# Patient Record
Sex: Male | Born: 2000
Health system: Southern US, Community
[De-identification: ages and names within clinical notes are randomized; demographics above are authoritative.]

## PROBLEM LIST (undated history)

## (undated) DIAGNOSIS — S21339A Puncture wound without foreign body of unspecified front wall of thorax with penetration into thoracic cavity, initial encounter: Secondary | ICD-10-CM

## (undated) DIAGNOSIS — T7840XA Allergy, unspecified, initial encounter: Secondary | ICD-10-CM

## (undated) DIAGNOSIS — J45909 Unspecified asthma, uncomplicated: Secondary | ICD-10-CM

## (undated) DIAGNOSIS — W3400XA Accidental discharge from unspecified firearms or gun, initial encounter: Secondary | ICD-10-CM

## (undated) HISTORY — DX: Allergy, unspecified, initial encounter: T78.40XA

## (undated) HISTORY — DX: Accidental discharge from unspecified firearms or gun, initial encounter: W34.00XA

## (undated) HISTORY — PX: KNEE ARTHROSCOPY W/ MENISCAL REPAIR: SHX1877

## (undated) HISTORY — DX: Puncture wound without foreign body of unspecified front wall of thorax with penetration into thoracic cavity, initial encounter: S21.339A

## (undated) HISTORY — PX: WISDOM TOOTH EXTRACTION: SHX21

---
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
## 2018-09-09 ENCOUNTER — Ambulatory Visit: Payer: 59 | Admitting: Family Medicine
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
## 2019-04-12 ENCOUNTER — Ambulatory Visit (INDEPENDENT_AMBULATORY_CARE_PROVIDER_SITE_OTHER): Payer: Managed Care, Other (non HMO) | Admitting: Medical

## 2019-04-12 ENCOUNTER — Other Ambulatory Visit: Payer: Self-pay

## 2019-04-12 ENCOUNTER — Encounter: Payer: Self-pay | Admitting: Medical

## 2019-04-12 VITALS — BP 126/60 | HR 85 | Temp 98.4°F | Ht 70.0 in | Wt 267.8 lb

## 2019-04-12 DIAGNOSIS — Z833 Family history of diabetes mellitus: Secondary | ICD-10-CM | POA: Diagnosis not present

## 2019-04-12 DIAGNOSIS — Z0000 Encounter for general adult medical examination without abnormal findings: Secondary | ICD-10-CM | POA: Diagnosis not present

## 2019-04-12 DIAGNOSIS — R61 Generalized hyperhidrosis: Secondary | ICD-10-CM | POA: Insufficient documentation

## 2019-04-12 DIAGNOSIS — Z111 Encounter for screening for respiratory tuberculosis: Secondary | ICD-10-CM

## 2019-04-12 DIAGNOSIS — Z6838 Body mass index (BMI) 38.0-38.9, adult: Secondary | ICD-10-CM | POA: Diagnosis not present

## 2019-04-12 DIAGNOSIS — Z131 Encounter for screening for diabetes mellitus: Secondary | ICD-10-CM

## 2019-04-12 DIAGNOSIS — M795 Residual foreign body in soft tissue: Secondary | ICD-10-CM

## 2019-04-12 DIAGNOSIS — G479 Sleep disorder, unspecified: Secondary | ICD-10-CM

## 2019-04-12 DIAGNOSIS — J4520 Mild intermittent asthma, uncomplicated: Secondary | ICD-10-CM | POA: Insufficient documentation

## 2019-04-12 MED ORDER — ALBUTEROL SULFATE HFA 108 (90 BASE) MCG/ACT IN AERS: 2.0000 | INHALATION_SPRAY | Freq: Four times a day (QID) | RESPIRATORY_TRACT | 1 refills | Status: DC | PRN
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
## 2019-04-13 LAB — COMPREHENSIVE METABOLIC PANEL
ALT: 32 IU/L (ref 0–44)
AST: 23 IU/L (ref 0–40)
Albumin/Globulin Ratio: 1.8 (ref 1.2–2.2)
Albumin: 4.8 g/dL (ref 4.1–5.2)
Alkaline Phosphatase: 98 IU/L (ref 39–117)
BUN/Creatinine Ratio: 9 (ref 9–20)
BUN: 9 mg/dL (ref 6–20)
Bilirubin Total: 0.2 mg/dL (ref 0.0–1.2)
CO2: 24 mmol/L (ref 20–29)
Calcium: 9.8 mg/dL (ref 8.7–10.2)
Chloride: 101 mmol/L (ref 96–106)
Creatinine, Ser: 0.98 mg/dL (ref 0.76–1.27)
GFR calc Af Amer: 128 mL/min/{1.73_m2} (ref >59)
GFR calc non Af Amer: 111 mL/min/{1.73_m2} (ref >59)
Globulin, Total: 2.7 g/dL (ref 1.5–4.5)
Glucose: 80 mg/dL (ref 65–99)
Potassium: 4.5 mmol/L (ref 3.5–5.2)
Sodium: 141 mmol/L (ref 134–144)
Total Protein: 7.5 g/dL (ref 6.0–8.5)

## 2019-04-13 LAB — CBC WITH DIFFERENTIAL/PLATELET
Basophils Absolute: 0 10*3/uL (ref 0.0–0.2)
Basos: 0 %
EOS (ABSOLUTE): 0.1 10*3/uL (ref 0.0–0.4)
Eos: 2 %
Hematocrit: 39.4 % (ref 37.5–51.0)
Hemoglobin: 13.2 g/dL (ref 13.0–17.7)
Immature Grans (Abs): 0 10*3/uL (ref 0.0–0.1)
Immature Granulocytes: 0 %
Lymphocytes Absolute: 2.6 10*3/uL (ref 0.7–3.1)
Lymphs: 34 %
MCH: 31.9 pg (ref 26.6–33.0)
MCHC: 33.5 g/dL (ref 31.5–35.7)
MCV: 95 fL (ref 79–97)
Monocytes Absolute: 0.6 10*3/uL (ref 0.1–0.9)
Monocytes: 8 %
Neutrophils Absolute: 4.1 10*3/uL (ref 1.4–7.0)
Neutrophils: 56 %
Platelets: 300 10*3/uL (ref 150–450)
RBC: 4.14 x10E6/uL (ref 4.14–5.80)
RDW: 10.8 % — ABNORMAL LOW (ref 11.6–15.4)
WBC: 7.5 10*3/uL (ref 3.4–10.8)

## 2019-04-13 LAB — HEMOGLOBIN A1C
Est. average glucose Bld gHb Est-mCnc: 82 mg/dL
Hgb A1c MFr Bld: 4.5 % — ABNORMAL LOW (ref 4.8–5.6)

## 2019-04-13 LAB — TSH: TSH: 1.18 u[IU]/mL (ref 0.450–4.500)

## 2019-05-03 ENCOUNTER — Other Ambulatory Visit: Payer: Self-pay

## 2019-05-03 ENCOUNTER — Encounter (HOSPITAL_COMMUNITY): Payer: Self-pay | Admitting: Emergency Medicine

## 2019-05-03 ENCOUNTER — Emergency Department (HOSPITAL_COMMUNITY)
Admission: EM | Admit: 2019-05-03 | Discharge: 2019-05-03 | Disposition: A | Discharge status: Home / Self Care | Payer: Managed Care, Other (non HMO) | Attending: Emergency Medicine | Admitting: Emergency Medicine

## 2019-05-03 DIAGNOSIS — Y92019 Unspecified place in single-family (private) house as the place of occurrence of the external cause: Secondary | ICD-10-CM | POA: Insufficient documentation

## 2019-05-03 DIAGNOSIS — Y9301 Activity, walking, marching and hiking: Secondary | ICD-10-CM | POA: Insufficient documentation

## 2019-05-03 DIAGNOSIS — Y29XXXA Contact with blunt object, undetermined intent, initial encounter: Secondary | ICD-10-CM | POA: Diagnosis not present

## 2019-05-03 DIAGNOSIS — S91311A Laceration without foreign body, right foot, initial encounter: Secondary | ICD-10-CM | POA: Diagnosis not present

## 2019-05-03 DIAGNOSIS — Y999 Unspecified external cause status: Secondary | ICD-10-CM | POA: Diagnosis not present

## 2019-05-03 DIAGNOSIS — J4520 Mild intermittent asthma, uncomplicated: Secondary | ICD-10-CM | POA: Insufficient documentation

## 2019-05-03 NOTE — ED Triage Notes (Signed)
Pt reports cutting bottom of right foot on a baby gate around 10 last night. No bleeding at this time.

## 2019-05-03 NOTE — Discharge Instructions (Addendum)
Get help right away if:  You develop severe swelling around the wound.  Your pain suddenly increases and is severe.  You develop painful lumps near the wound or on skin anywhere else on your body.  You have a red streak going away from your wound.  The wound is on your hand or foot and you cannot properly move a finger or toe.  The wound is on your hand or foot, and you notice that your fingers or toes look pale or bluish.
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
## 2019-06-23 ENCOUNTER — Other Ambulatory Visit: Payer: Self-pay

## 2019-06-23 ENCOUNTER — Emergency Department (HOSPITAL_COMMUNITY)
Admission: EM | Admit: 2019-06-23 | Discharge: 2019-06-23 | Disposition: A | Discharge status: Home / Self Care | Payer: 59 | Attending: Emergency Medicine | Admitting: Emergency Medicine

## 2019-06-23 ENCOUNTER — Emergency Department (HOSPITAL_COMMUNITY): Payer: 59

## 2019-06-23 DIAGNOSIS — W3400XA Accidental discharge from unspecified firearms or gun, initial encounter: Secondary | ICD-10-CM | POA: Diagnosis not present

## 2019-06-23 DIAGNOSIS — Z87828 Personal history of other (healed) physical injury and trauma: Secondary | ICD-10-CM

## 2019-06-23 DIAGNOSIS — Y999 Unspecified external cause status: Secondary | ICD-10-CM | POA: Insufficient documentation

## 2019-06-23 DIAGNOSIS — Y939 Activity, unspecified: Secondary | ICD-10-CM | POA: Diagnosis not present

## 2019-06-23 DIAGNOSIS — Y929 Unspecified place or not applicable: Secondary | ICD-10-CM | POA: Insufficient documentation

## 2019-06-23 DIAGNOSIS — S70351D Superficial foreign body, right thigh, subsequent encounter: Secondary | ICD-10-CM

## 2019-06-23 DIAGNOSIS — S79921A Unspecified injury of right thigh, initial encounter: Secondary | ICD-10-CM | POA: Diagnosis present

## 2019-06-23 DIAGNOSIS — J45909 Unspecified asthma, uncomplicated: Secondary | ICD-10-CM | POA: Insufficient documentation

## 2019-06-23 DIAGNOSIS — S71141A Puncture wound with foreign body, right thigh, initial encounter: Secondary | ICD-10-CM | POA: Insufficient documentation

## 2019-06-23 MED ORDER — LIDOCAINE-EPINEPHRINE (PF) 2 %-1:200000 IJ SOLN
10.0000 mL | Freq: Once | INTRAMUSCULAR | Status: DC
  Filled 2019-06-23: qty 20

## 2019-06-23 MED ORDER — DOXYCYCLINE HYCLATE 100 MG PO CAPS: 100.0000 mg | ORAL_CAPSULE | Freq: Two times a day (BID) | ORAL | 0 refills | Status: DC
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
## 2019-10-18 ENCOUNTER — Ambulatory Visit
Admission: EM | Admit: 2019-10-18 | Discharge: 2019-10-18 | Disposition: A | Discharge status: Home / Self Care | Payer: 59 | Attending: Physician Assistant | Admitting: Physician Assistant

## 2019-10-18 ENCOUNTER — Other Ambulatory Visit: Payer: Self-pay

## 2019-10-18 DIAGNOSIS — Z1152 Encounter for screening for COVID-19: Secondary | ICD-10-CM

## 2019-10-18 NOTE — Discharge Instructions (Signed)

## 2019-10-18 NOTE — ED Triage Notes (Signed)
Pt requesting covid testing., no sx's. °

## 2019-10-20 LAB — NOVEL CORONAVIRUS, NAA: SARS-CoV-2, NAA: NOT DETECTED

## 2019-10-20 LAB — SARS-COV-2, NAA 2 DAY TAT

## 2019-12-06 ENCOUNTER — Telehealth: Payer: Self-pay

## 2019-12-06 ENCOUNTER — Other Ambulatory Visit: Payer: Self-pay

## 2019-12-06 ENCOUNTER — Ambulatory Visit: Payer: 59 | Admitting: Medical

## 2019-12-06 ENCOUNTER — Encounter: Payer: Self-pay | Admitting: Medical

## 2019-12-06 VITALS — BP 130/66 | HR 85 | Ht 70.0 in | Wt 250.0 lb

## 2019-12-06 DIAGNOSIS — M549 Dorsalgia, unspecified: Secondary | ICD-10-CM | POA: Insufficient documentation

## 2019-12-06 DIAGNOSIS — Z113 Encounter for screening for infections with a predominantly sexual mode of transmission: Secondary | ICD-10-CM | POA: Diagnosis not present

## 2019-12-06 DIAGNOSIS — M25512 Pain in left shoulder: Secondary | ICD-10-CM | POA: Diagnosis not present

## 2019-12-06 MED ORDER — NAPROXEN 500 MG PO TABS: 500.0000 mg | ORAL_TABLET | Freq: Two times a day (BID) | ORAL | 0 refills | Status: DC

## 2019-12-06 MED ORDER — METHOCARBAMOL 500 MG PO TABS: 500.0000 mg | ORAL_TABLET | Freq: Every evening | ORAL | 0 refills | Status: DC | PRN

## 2019-12-06 NOTE — Telephone Encounter (Signed)
Per your note at check you have agreed to see the pts. Girlfriend and child who both have mediciad. Her name is Daniel Ibarra 04/29/01 and his son name is Daniel Ibarra 05/27/18.

## 2019-12-06 NOTE — Progress Notes (Signed)
Done

## 2019-12-06 NOTE — Patient Instructions (Signed)
Your pain suggest muscle inflammation, muscle spasm, possibly even some arthritis from prior scapular fracture.  Recommendations:  Begin Naprosyn 500 mg twice daily with food for the next 7 to 10 days for pain and inflammation  Begin Robaxin muscle relaxer at bedtime the next few nights, then as needed for muscle spasms and soreness  You can use heat to the shoulder and upper back such as hot towel occasionally for heat therapy.  He can also do cold therapy with a cold wet towel or ice pack 20 minutes at a time.  You can use an arm sling as needed when it seems flared up  We will make a referral to physical therapy for additional recommendations  If not much improved within a month then we will get an updated x-ray   We will check STD screen today

## 2019-12-06 NOTE — Telephone Encounter (Signed)
That is correct. I did inform him that his child would be seen for non well visits as we don't participate in vaccine program with medicaid, so he would need child to see health dept or other pediatrician office for well visits and vaccines.
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
## 2019-12-07 LAB — HIV ANTIBODY (ROUTINE TESTING W REFLEX): HIV Screen 4th Generation wRfx: NONREACTIVE

## 2019-12-07 LAB — HEPATITIS B SURFACE ANTIGEN: Hepatitis B Surface Ag: NEGATIVE

## 2019-12-07 LAB — HEPATITIS C ANTIBODY: Hep C Virus Ab: 0.1 s/co ratio (ref 0.0–0.9)

## 2019-12-07 LAB — RPR: RPR Ser Ql: NONREACTIVE

## 2019-12-08 LAB — GC/CHLAMYDIA PROBE AMP
Chlamydia trachomatis, NAA: NEGATIVE
Neisseria Gonorrhoeae by PCR: NEGATIVE

## 2019-12-22 ENCOUNTER — Other Ambulatory Visit: Payer: Self-pay

## 2019-12-22 ENCOUNTER — Ambulatory Visit: Payer: 59 | Attending: Medical

## 2019-12-22 DIAGNOSIS — M6281 Muscle weakness (generalized): Secondary | ICD-10-CM | POA: Diagnosis present

## 2019-12-22 DIAGNOSIS — R0781 Pleurodynia: Secondary | ICD-10-CM

## 2019-12-22 DIAGNOSIS — M25512 Pain in left shoulder: Secondary | ICD-10-CM

## 2019-12-22 DIAGNOSIS — R0789 Other chest pain: Secondary | ICD-10-CM

## 2019-12-23 NOTE — Patient Instructions (Signed)
° °  ITB stretch for lateral L rib stretch
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
## 2020-01-05 ENCOUNTER — Telehealth: Payer: Self-pay

## 2020-01-05 ENCOUNTER — Ambulatory Visit: Payer: 59 | Admitting: Medical

## 2020-01-05 ENCOUNTER — Ambulatory Visit: Payer: 59 | Attending: Medical

## 2020-01-05 NOTE — Telephone Encounter (Signed)
Called pt regarding missed visit at 8am. Pt did not answer, so LM VM and reminded pt of next appt on 12/9 at 3pm. Requested pt call at least 24 hours in advance, if he cannot make the appt.   Bettey Mare. Corliss Marcus, PT, DPT

## 2020-01-06 ENCOUNTER — Encounter: Payer: Self-pay | Admitting: Medical

## 2020-01-12 ENCOUNTER — Ambulatory Visit: Payer: 59

## 2020-01-12 ENCOUNTER — Telehealth: Payer: Self-pay

## 2020-01-12 NOTE — Telephone Encounter (Signed)
Spoke to pt who requests to cancel all visits and discharge. Advised pt he may go back to physician for new prescription, should he want to come back.  Bettey Mare. Corliss Marcus, PT, DPT

## 2020-05-28 ENCOUNTER — Other Ambulatory Visit: Payer: Self-pay

## 2020-05-28 ENCOUNTER — Ambulatory Visit (INDEPENDENT_AMBULATORY_CARE_PROVIDER_SITE_OTHER): Payer: Self-pay

## 2020-05-28 ENCOUNTER — Encounter: Payer: Self-pay | Admitting: Emergency Medicine

## 2020-05-28 ENCOUNTER — Ambulatory Visit
Admission: EM | Admit: 2020-05-28 | Discharge: 2020-05-28 | Disposition: A | Discharge status: Home / Self Care | Payer: Self-pay | Attending: Emergency Medicine | Admitting: Emergency Medicine

## 2020-05-28 DIAGNOSIS — M79644 Pain in right finger(s): Secondary | ICD-10-CM

## 2020-05-28 DIAGNOSIS — Y9372 Activity, wrestling: Secondary | ICD-10-CM

## 2020-05-28 DIAGNOSIS — R2231 Localized swelling, mass and lump, right upper limb: Secondary | ICD-10-CM

## 2020-05-28 DIAGNOSIS — S6991XA Unspecified injury of right wrist, hand and finger(s), initial encounter: Secondary | ICD-10-CM

## 2020-05-28 MED ORDER — IBUPROFEN 800 MG PO TABS: 800.0000 mg | ORAL_TABLET | Freq: Three times a day (TID) | ORAL | 0 refills | Status: DC
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
## 2020-05-28 NOTE — Discharge Instructions (Signed)
Tylenol and ibuprofen for pain Follow-up with hand/sports medicine for further evaluation of finger injury if not regaining range of motion Ice

## 2020-05-28 NOTE — ED Triage Notes (Signed)
Pt presents with right pinky finger pain. States was wrestling with brother and landed on hand wrong xs 2 days ago.  States ibuprofen gave minimal relief.

## 2020-06-13 ENCOUNTER — Other Ambulatory Visit: Payer: Self-pay

## 2020-06-13 ENCOUNTER — Ambulatory Visit (INDEPENDENT_AMBULATORY_CARE_PROVIDER_SITE_OTHER): Payer: Self-pay | Admitting: Family Medicine

## 2020-06-13 DIAGNOSIS — M79644 Pain in right finger(s): Secondary | ICD-10-CM | POA: Insufficient documentation
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
## 2020-06-13 NOTE — Patient Instructions (Addendum)
It was great to meet you today! Thank you for letting me participate in your care!  Today, we discussed your pinky injury and I am concerned you may have torn the tendon in your finger. The condition is called [place] finger. I am referring you to an orthopedic hand specialist. Please make sure you get seen by them soon. If you do not hear anything soon please call me.  [facility] will be giving you a call to schedule an appt - [phone]. [address], [place]  The day of the visit you will be required to pay $201.60 and may be charged an additional fee afterwards. Every subsequent visit will be $140.70 up front and if you owe any outstanding balance.  Please call us if you have any questions or any issues getting scheduled.  Be well, Jules Schick, DO PGY-4, Sports Medicine Fellow [facility]

## 2020-06-13 NOTE — Assessment & Plan Note (Signed)
Concern for [place] finger with inability to flex his right 5th digit at the PIP and DIP joints. Ultrasound shows intact tendon but some area of swelling at the insertion at the DIP. - F/u with hand orthopedic specialist.

## 2020-06-13 NOTE — Progress Notes (Signed)
    SUBJECTIVE:   CHIEF COMPLAINT / HPI:   Right Finger Pain Daniel Ibarra is a 20y/o male who presents today with an inability to flex his finger after an injury wrestling with his nephews. This occurred on 05/26/2020. He had pain and swelling but did not feel or hear a pop. He has had resolution of the swelling but still cannot flex his 5th digit. He can move it at the MCP joint but no other. It is not tender to palpation. No previous known injury. He was previously seen at an urgent care and placed in a splint and had x-rays which were negative for fracture.  PERTINENT  PMH / PSH: Asthma  OBJECTIVE:   BP 110/78   Ht 5\' 11"  (1.803 m)   Wt 244 lb (110.7 kg)   BMI 34.03 kg/m   No flowsheet data found.  MSK: Examination of the right 5th digit significant for no erythema, no ecchymosis. No active range of motion at the DIP or PIP joints. No ability to flex against resistance. He can move the MCP joint in lfexion and extension. No numbness or tingling.   Limited MSK U/S No step off sign indicating fracture. His flexor tendon is visible and intact past the PIP joint and seems to insert at the base of the distal phalange. Some swelling and abnormality seen at this area. Impression: Possible partial tear of the flexor tendon.  ASSESSMENT/PLAN:   Finger pain, right Concern for finger with inability to flex his right 5th digit at the PIP and DIP joints. Ultrasound shows intact tendon but some area of swelling at the insertion at the DIP. - F/u with hand orthopedic specialist.     [place], DO PGY-4, Sports Medicine Fellow [facility]

## 2021-06-19 ENCOUNTER — Ambulatory Visit: Payer: Managed Care, Other (non HMO) | Admitting: Medical

## 2021-06-19 VITALS — BP 110/70 | HR 59 | Temp 97.2°F | Wt 261.0 lb

## 2021-06-19 DIAGNOSIS — Z87828 Personal history of other (healed) physical injury and trauma: Secondary | ICD-10-CM

## 2021-06-19 DIAGNOSIS — M25512 Pain in left shoulder: Secondary | ICD-10-CM

## 2021-06-19 DIAGNOSIS — R0789 Other chest pain: Secondary | ICD-10-CM
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
## 2021-06-19 NOTE — Patient Instructions (Signed)
Left chest wall and shoulder pain ?It seems like your current pains are related to probably sleeping directly on the shoulder causing some discomfort as well as musculoskeletal pain just from wear and tear and routine activities you do day-to-day ?There could also be a component of arthritic pain where you have had trauma to the ribs and shoulder blade in the past ?The only objective finding today was that there is a little bit of laxity of your left shoulder.  This may be causing pain from time to time but probably does not need any type of surgical intervention at this time ? ?Recommendations ?On days you have worse pain or flareup of pain I would use Aleve over-the-counter once or twice daily for a few days in a row for pain and inflammation ?You can use ice water pack or bag of frozen peas over the shoulder 20 minutes at a time on days that is worse pain ?You can purchase an arm sling such as in the picture below to use periodically throughout the day ?When you have the opportunity to help rest the arm ?When you use an arm sling, make sure your arm is more horizontal such as in the picture below.  If the arm sling has your arm lifted up or laying down to lower it will probably not give you the support and comfort you need ?Avoid a lot of strenuous activity for the next few days since you are currently having a flareup of pain ?If you continue to have shoulder and chest wall pain more consistent over the next few weeks then let me know and I will refer you to orthopedics for further evaluation and treatment ? ? ? ?

## 2021-10-09 ENCOUNTER — Encounter: Payer: Self-pay | Admitting: Internal Medicine

## 2021-11-12 ENCOUNTER — Encounter: Payer: Self-pay | Admitting: Internal Medicine

## 2022-02-17 ENCOUNTER — Encounter: Payer: Self-pay | Admitting: Medical

## 2022-02-17 ENCOUNTER — Ambulatory Visit: Payer: BC Managed Care – PPO | Admitting: Medical

## 2022-02-17 VITALS — BP 126/70 | Resp 16 | Ht 69.75 in | Wt 250.0 lb

## 2022-02-17 DIAGNOSIS — Z87828 Personal history of other (healed) physical injury and trauma: Secondary | ICD-10-CM

## 2022-02-17 DIAGNOSIS — Z0000 Encounter for general adult medical examination without abnormal findings: Secondary | ICD-10-CM | POA: Diagnosis not present

## 2022-02-17 DIAGNOSIS — Z833 Family history of diabetes mellitus: Secondary | ICD-10-CM

## 2022-02-17 DIAGNOSIS — J4520 Mild intermittent asthma, uncomplicated: Secondary | ICD-10-CM | POA: Diagnosis not present

## 2022-02-17 DIAGNOSIS — Z131 Encounter for screening for diabetes mellitus: Secondary | ICD-10-CM

## 2022-02-17 DIAGNOSIS — Z13220 Encounter for screening for lipoid disorders: Secondary | ICD-10-CM

## 2022-02-17 DIAGNOSIS — Z113 Encounter for screening for infections with a predominantly sexual mode of transmission: Secondary | ICD-10-CM

## 2022-02-17 DIAGNOSIS — M795 Residual foreign body in soft tissue: Secondary | ICD-10-CM

## 2022-02-17 LAB — POCT URINALYSIS DIP (PROADVANTAGE DEVICE)
Bilirubin, UA: NEGATIVE
Blood, UA: NEGATIVE
Glucose, UA: NEGATIVE mg/dL
Ketones, POC UA: NEGATIVE mg/dL
Leukocytes, UA: NEGATIVE
Nitrite, UA: NEGATIVE
Protein Ur, POC: NEGATIVE mg/dL
Specific Gravity, Urine: 1.015
Urobilinogen, Ur: 0.2
pH, UA: 6.5 (ref 5.0–8.0)

## 2022-02-17 LAB — CBC
Platelets: 322 10*3/uL (ref 150–450)
RDW: 11.4 % — ABNORMAL LOW (ref 11.6–15.4)

## 2022-02-17 LAB — LIPID PANEL

## 2022-02-17 LAB — COMPREHENSIVE METABOLIC PANEL

## 2022-02-17 LAB — HEPATITIS C ANTIBODY
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
## 2022-02-18 LAB — LIPID PANEL: VLDL Cholesterol Cal: 11 mg/dL (ref 5–40)

## 2022-02-18 LAB — RPR+HIV+GC+CT PANEL

## 2022-02-18 LAB — COMPREHENSIVE METABOLIC PANEL
ALT: 200 IU/L — ABNORMAL HIGH (ref 0–44)
Albumin/Globulin Ratio: 1.8 (ref 1.2–2.2)
Alkaline Phosphatase: 88 IU/L (ref 44–121)
Calcium: 9.2 mg/dL (ref 8.7–10.2)
Chloride: 102 mmol/L (ref 96–106)
Creatinine, Ser: 0.85 mg/dL (ref 0.76–1.27)
Globulin, Total: 2.5 g/dL (ref 1.5–4.5)
Glucose: 102 mg/dL — ABNORMAL HIGH (ref 70–99)

## 2022-02-18 LAB — HEPATITIS B SURFACE ANTIGEN: Hepatitis B Surface Ag: NEGATIVE

## 2022-02-18 LAB — CBC
Hematocrit: 36.5 % — ABNORMAL LOW (ref 37.5–51.0)
Hemoglobin: 12.4 g/dL — ABNORMAL LOW (ref 13.0–17.7)
WBC: 6.1 10*3/uL (ref 3.4–10.8)

## 2022-02-18 NOTE — Progress Notes (Signed)
Lab findings: Blood sugar was slightly elevated putting at risk for diabetes. Liver tests are elevated quite a bit.  Kidney and electrolytes normal Blood counts show anemia Cholesterol numbers okay HIV and syphilis negative.  Still pending gonorrhea and Chlamydia test.  Negative for hepatitis C, negative hepatitis B  I am concerned about the liver tests, blood sugars, and anemia  Are you drinking alcohol?  If so how much do you drink on a regular basis?  These labs suggest that you are at risk for diabetes unless you were nonfasting for the appointment.  I was on the impression you were fasting  Liver test can be elevated for several reasons.  Depending on how much alcohol you are drinking, we may need to check some of the things

## 2022-02-20 LAB — COMPREHENSIVE METABOLIC PANEL
Albumin: 4.6 g/dL (ref 4.3–5.2)
BUN/Creatinine Ratio: 6 — ABNORMAL LOW (ref 9–20)
BUN: 5 mg/dL — ABNORMAL LOW (ref 6–20)
Potassium: 4.5 mmol/L (ref 3.5–5.2)
Sodium: 139 mmol/L (ref 134–144)
Total Protein: 7.1 g/dL (ref 6.0–8.5)

## 2022-02-20 LAB — RPR+HIV+GC+CT PANEL
HIV Screen 4th Generation wRfx: NONREACTIVE
RPR Ser Ql: NONREACTIVE

## 2022-02-20 LAB — CBC
MCH: 32.2 pg (ref 26.6–33.0)
MCHC: 34 g/dL (ref 31.5–35.7)
MCV: 95 fL (ref 79–97)
RBC: 3.85 x10E6/uL — ABNORMAL LOW (ref 4.14–5.80)

## 2022-02-20 LAB — LIPID PANEL: LDL Chol Calc (NIH): 62 mg/dL (ref 0–99)

## 2022-02-21 NOTE — Progress Notes (Signed)
Results sent through MyChart

## 2022-03-21 ENCOUNTER — Ambulatory Visit: Payer: BC Managed Care – PPO | Admitting: Medical

## 2022-03-24 ENCOUNTER — Encounter: Payer: Self-pay | Admitting: Medical

## 2022-06-21 ENCOUNTER — Emergency Department (HOSPITAL_BASED_OUTPATIENT_CLINIC_OR_DEPARTMENT_OTHER)
Admission: EM | Admit: 2022-06-21 | Discharge: 2022-06-21 | Disposition: A | Discharge status: Home / Self Care | Payer: Self-pay | Attending: Emergency Medicine | Admitting: Emergency Medicine

## 2022-06-21 ENCOUNTER — Emergency Department (HOSPITAL_BASED_OUTPATIENT_CLINIC_OR_DEPARTMENT_OTHER): Payer: Self-pay

## 2022-06-21 ENCOUNTER — Encounter (HOSPITAL_BASED_OUTPATIENT_CLINIC_OR_DEPARTMENT_OTHER): Payer: Self-pay

## 2022-06-21 DIAGNOSIS — M2392 Unspecified internal derangement of left knee: Secondary | ICD-10-CM | POA: Insufficient documentation

## 2022-06-21 MED ORDER — IBUPROFEN 600 MG PO TABS: 600.0000 mg | ORAL_TABLET | Freq: Four times a day (QID) | ORAL | 0 refills | Status: DC | PRN

## 2022-06-21 MED ORDER — HYDROCODONE-ACETAMINOPHEN 5-325 MG PO TABS: 1.0000 | ORAL_TABLET | ORAL | 0 refills | Status: DC | PRN

## 2022-06-21 NOTE — ED Triage Notes (Signed)
Patient having left knee pain on Thursday. He stated he felt a pop and then pain.
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
## 2022-06-24 ENCOUNTER — Encounter: Payer: Self-pay | Admitting: Medical

## 2022-06-24 ENCOUNTER — Ambulatory Visit: Payer: BC Managed Care – PPO | Admitting: Medical

## 2022-06-24 VITALS — BP 102/60 | HR 72 | Ht 71.0 in | Wt 249.2 lb

## 2022-06-24 DIAGNOSIS — M25562 Pain in left knee: Secondary | ICD-10-CM | POA: Diagnosis not present

## 2022-06-24 DIAGNOSIS — M25462 Effusion, left knee: Secondary | ICD-10-CM | POA: Diagnosis not present
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
## 2023-02-19 ENCOUNTER — Encounter: Payer: BC Managed Care – PPO | Admitting: Medical
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
## 2023-06-01 ENCOUNTER — Emergency Department (HOSPITAL_COMMUNITY)
Admission: EM | Admit: 2023-06-01 | Discharge: 2023-06-01 | Disposition: A | Discharge status: Home / Self Care | Payer: Self-pay | Attending: Emergency Medicine | Admitting: Emergency Medicine

## 2023-06-01 ENCOUNTER — Other Ambulatory Visit: Payer: Self-pay

## 2023-06-01 ENCOUNTER — Encounter (HOSPITAL_COMMUNITY): Payer: Self-pay | Admitting: Emergency Medicine

## 2023-06-01 DIAGNOSIS — L02214 Cutaneous abscess of groin: Secondary | ICD-10-CM | POA: Insufficient documentation

## 2023-06-01 DIAGNOSIS — L0291 Cutaneous abscess, unspecified: Secondary | ICD-10-CM

## 2023-06-01 MED ORDER — CEPHALEXIN 250 MG PO CAPS
500.0000 mg | ORAL_CAPSULE | Freq: Once | ORAL | Status: AC
  Administered 2023-06-01: 500 mg via ORAL
  Filled 2023-06-01: qty 2

## 2023-06-01 MED ORDER — DOXYCYCLINE HYCLATE 100 MG PO CAPS: 100.0000 mg | ORAL_CAPSULE | Freq: Two times a day (BID) | ORAL | 0 refills | Status: AC

## 2023-06-01 MED ORDER — DOXYCYCLINE HYCLATE 100 MG PO TABS
100.0000 mg | ORAL_TABLET | Freq: Once | ORAL | Status: AC
  Administered 2023-06-01: 100 mg via ORAL
  Filled 2023-06-01: qty 1

## 2023-06-01 NOTE — Discharge Instructions (Addendum)
 Apply warm compresses and heat to your abscess for the next 48 hours.

## 2023-06-01 NOTE — ED Provider Notes (Signed)
 [name] Provider Note   CSN: [id number] Arrival date & time: 06/01/23  1009     History  Chief Complaint  Patient presents with   Groin Pain    Daniel Ibarra is a 23 y.o. male presented to ED with 3 days of tenderness in his right inguinal region.  Reports he had a long car ride and felt that it got worse.  Denies fevers or chills.  HPI     Home Medications Prior to Admission medications   Medication Sig Start Date End Date Taking? Authorizing Provider  doxycycline  (VIBRAMYCIN ) 100 MG capsule Take 1 capsule (100 mg total) by mouth 2 (two) times daily for 7 days. 06/01/23 06/08/23 Yes Ikeem Cleckler, Janalyn Me, MD      Allergies    Corn-containing products, Other, and Pineapple    Review of Systems   Review of Systems  Physical Exam Updated Vital Signs BP 128/77 (BP Location: Right Arm)   Pulse 69   Temp 98.7 F (37.1 C) (Oral)   Resp 16   Ht 5\' 11"  (1.803 m)   Wt 113 kg   SpO2 100%   BMI 34.75 kg/m  Physical Exam Constitutional:      General: He is not in acute distress. HENT:     Head: Normocephalic and atraumatic.  Eyes:     Conjunctiva/sclera: Conjunctivae normal.     Pupils: Pupils are equal, round, and reactive to light.  Cardiovascular:     Rate and Rhythm: Normal rate and regular rhythm.  Pulmonary:     Effort: Pulmonary effort is normal. No respiratory distress.  Genitourinary:    Comments: GU exam was performed with patient's nurse present as chaperone Normal penis and scrotum Small fluctuant mass lateral to the right scrotum near the right lower inguinal fold, with purulent drainage Skin:    General: Skin is warm and dry.  Neurological:     General: No focal deficit present.     Mental Status: He is alert. Mental status is at baseline.  Psychiatric:        Mood and Affect: Mood normal.        Behavior: Behavior normal.     ED Results / Procedures / Treatments   Labs (all labs ordered are listed,  but only abnormal results are displayed) Labs Reviewed - No data to display  EKG None  Radiology No results found.  Procedures Procedures    Medications Ordered in ED Medications  cephALEXin  (KEFLEX ) capsule 500 mg (500 mg Oral Given 06/01/23 1054)  doxycycline  (VIBRA -TABS) tablet 100 mg (100 mg Oral Given 06/01/23 1054)    ED Course/ Medical Decision Making/ A&P                                 Medical Decision Making Risk Prescription drug management.   Patient is presenting with concern for small abscess in the right inguinal region outside the scrotum.  No evidence of surrounding infection or deep space infection.  Doubt necrotizing fasciitis.  This wound was spontaneously draining upon my exam, and I was able to express a moderate amount of pus until collection was drained entirely.  I did not convert this to a larger incision given its superficiality and spontaneous drainage.  Patient remained comfortable throughout this process.  Do not believe he is needing blood works.  I will prescribe him antibiotics given the location of this wound,  and advised him not to shave this region in the future.  Discussed warm compresses at home.        Final Clinical Impression(s) / ED Diagnoses Final diagnoses:  Abscess    Rx / DC Orders ED Discharge Orders          Ordered    doxycycline  (VIBRAMYCIN ) 100 MG capsule  2 times daily        06/01/23 1044              Lillie Portner, Janalyn Me, MD 06/01/23 1152

## 2023-06-01 NOTE — ED Triage Notes (Signed)
 Pt reports he noticed a knot on right side of groin Saturday evening/Sunday morning. Denies heavy lifting but may have been lifting up kids at family reunion. Slight pain when walking. Denies urinary symptoms.

## 2023-11-30 ENCOUNTER — Encounter: Payer: Self-pay | Admitting: *Deleted

## 2023-11-30 ENCOUNTER — Ambulatory Visit
Admission: EM | Admit: 2023-11-30 | Discharge: 2023-11-30 | Disposition: A | Discharge status: Home / Self Care | Payer: Self-pay | Attending: Student | Admitting: Student

## 2023-11-30 ENCOUNTER — Other Ambulatory Visit: Payer: Self-pay

## 2023-11-30 DIAGNOSIS — L03011 Cellulitis of right finger: Secondary | ICD-10-CM

## 2023-11-30 MED ORDER — AMOXICILLIN-POT CLAVULANATE 875-125 MG PO TABS: 1.0000 | ORAL_TABLET | Freq: Two times a day (BID) | ORAL | 0 refills | Status: AC

## 2023-11-30 NOTE — ED Triage Notes (Signed)
 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 Right thumb is swollen and painful. He has not seen it draining at all but states it feels like something is in there. He has been soaking his thumb in epsom salts

## 2023-11-30 NOTE — Discharge Instructions (Addendum)
-  Start the antibiotic-Augmentin (amoxicillin-clavulanate), 1 pill every 12 hours for 7 days.  You can take this with food like with breakfast and dinner. -Soak in warm water twice daily, for about 5 minutes -Wash with soap and water only - do not use hydrogen peroxide or alcohol to clean -Schedule an appt next time, so that you can skip the line!
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
